# Patient Record
Sex: Male | Born: 1990 | Race: Black or African American | Hispanic: No | Marital: Single | Smoking: Current some day smoker
Health system: Southern US, Community
[De-identification: ages and names within clinical notes are randomized; demographics above are authoritative.]

---
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
## 2014-07-27 ENCOUNTER — Emergency Department (HOSPITAL_COMMUNITY)
Admission: EM | Admit: 2014-07-27 | Discharge: 2014-07-27 | Disposition: A | Discharge status: Home / Self Care | Payer: Medicaid Other | Attending: Emergency Medicine | Admitting: Emergency Medicine

## 2014-07-27 ENCOUNTER — Encounter (HOSPITAL_COMMUNITY): Payer: Self-pay | Admitting: *Deleted

## 2014-07-27 DIAGNOSIS — Z202 Contact with and (suspected) exposure to infections with a predominantly sexual mode of transmission: Secondary | ICD-10-CM | POA: Insufficient documentation

## 2014-07-27 DIAGNOSIS — Z720 Tobacco use: Secondary | ICD-10-CM | POA: Insufficient documentation

## 2014-07-27 MED ORDER — LIDOCAINE HCL 1 % IJ SOLN
INTRAMUSCULAR | Status: AC
  Filled 2014-07-27: qty 20

## 2014-07-27 MED ORDER — AZITHROMYCIN 250 MG PO TABS
1000.0000 mg | ORAL_TABLET | Freq: Once | ORAL | Status: AC
  Administered 2014-07-27: 1000 mg via ORAL
  Filled 2014-07-27: qty 4

## 2014-07-27 MED ORDER — CEFTRIAXONE SODIUM 250 MG IJ SOLR
250.0000 mg | Freq: Once | INTRAMUSCULAR | Status: AC
  Administered 2014-07-27: 250 mg via INTRAMUSCULAR
  Filled 2014-07-27: qty 250

## 2014-07-27 NOTE — ED Notes (Signed)
Pt reports exposure to STD.  Denies any sxs at this time

## 2014-07-27 NOTE — Discharge Instructions (Signed)

## 2014-07-27 NOTE — ED Provider Notes (Signed)
CSN: [id number]     Arrival date & time 07/27/14  1656 History  This chart was scribed for non-physician practitioner Langston MaskerKaren Sofia, PA-C, working with Arby BarretteMarcy Pfeiffer, MD, by Andrew Auaven Small, ED Scribe. This patient was seen in room WTR5/WTR5 and the patient's care was started at 5:28 PM.   Chief Complaint  Patient presents with  . Exposure to STD   The history is provided by the patient. No language interpreter was used.   William Werner is a 24 y.o. male who presents to the Emergency Department complaining of an exposure to STD. Pt reports the mother of his child is being treated for chlamydia. He denies symptoms including dysuria and discharge. Pt denies being treated for STDs in the past. Pt denies drug allergies.     History reviewed. No pertinent past medical history. History reviewed. No pertinent past surgical history. No family history on file. History  Substance Use Topics  . Smoking status: Current Every Day Smoker -- 1.00 packs/day    Types: Cigarettes  . Smokeless tobacco: Not on file  . Alcohol Use: Yes   Review of Systems  Genitourinary: Negative for dysuria and discharge.  All other systems reviewed and are negative.  Allergies  Review of patient's allergies indicates no known allergies.  Home Medications   Prior to Admission medications   Not on File  BP 131/70 mmHg  Pulse 73  Temp(Src) 98.2 F (36.8 C) (Oral)  Resp 16  SpO2 100% Physical Exam  Constitutional: He is oriented to person, place, and time. He appears well-developed and well-nourished. No distress.  HENT:  Head: Normocephalic and atraumatic.  Eyes: Conjunctivae and EOM are normal.  Neck: Neck supple.  Cardiovascular: Normal rate.   Pulmonary/Chest: Effort normal.  Genitourinary: Circumcised. No discharge found.  Musculoskeletal: Normal range of motion.  Neurological: He is alert and oriented to person, place, and time.  Skin: Skin is warm and dry.  Psychiatric: He has a normal mood and affect.  His behavior is normal.  Nursing note and vitals reviewed.  ED Course  Procedures  DIAGNOSTIC STUDIES: Oxygen Saturation is 100% on RA, normal by my interpretation.    COORDINATION OF CARE: 5:35 PM- Pt advised of plan for treatment and pt agrees.  Labs Review Labs Reviewed - No data to display  Imaging Review No results found.   EKG Interpretation None      MDM  gc and ct pending.     Final diagnoses:  Exposure to chlamydia    Rocephin  Zithromax Safe sex AVS   Lonia SkinnerLeslie K HarlanSofia, PA-C 07/27/14 1744  Arby BarretteMarcy Pfeiffer, MD 07/29/14 1451

## 2014-07-28 LAB — GC/CHLAMYDIA PROBE AMP (~~LOC~~) NOT AT ARMC
Chlamydia: POSITIVE — AB
Neisseria Gonorrhea: NEGATIVE

## 2014-07-30 ENCOUNTER — Telehealth (HOSPITAL_COMMUNITY): Payer: Self-pay

## 2014-07-30 NOTE — ED Notes (Signed)
Spoke with pt. Informed of labs. Positive for chlamydia. Treated per protocol. Advised to notify partner(s) for testing and treatment. Advised to abstain from sexual activity x 10 days.

## 2014-07-31 ENCOUNTER — Telehealth (HOSPITAL_BASED_OUTPATIENT_CLINIC_OR_DEPARTMENT_OTHER): Payer: Self-pay | Admitting: Emergency Medicine

## 2014-09-26 ENCOUNTER — Emergency Department (HOSPITAL_COMMUNITY)
Admission: EM | Admit: 2014-09-26 | Discharge: 2014-09-26 | Disposition: A | Discharge status: Home / Self Care | Payer: Medicaid Other | Attending: Emergency Medicine | Admitting: Emergency Medicine

## 2014-09-26 ENCOUNTER — Encounter (HOSPITAL_COMMUNITY): Payer: Self-pay | Admitting: Emergency Medicine

## 2014-09-26 DIAGNOSIS — Y998 Other external cause status: Secondary | ICD-10-CM | POA: Insufficient documentation

## 2014-09-26 DIAGNOSIS — Z720 Tobacco use: Secondary | ICD-10-CM | POA: Insufficient documentation

## 2014-09-26 DIAGNOSIS — Y9389 Activity, other specified: Secondary | ICD-10-CM | POA: Insufficient documentation

## 2014-09-26 DIAGNOSIS — L089 Local infection of the skin and subcutaneous tissue, unspecified: Secondary | ICD-10-CM | POA: Insufficient documentation

## 2014-09-26 DIAGNOSIS — Y288XXA Contact with other sharp object, undetermined intent, initial encounter: Secondary | ICD-10-CM | POA: Insufficient documentation

## 2014-09-26 DIAGNOSIS — Y929 Unspecified place or not applicable: Secondary | ICD-10-CM | POA: Insufficient documentation

## 2014-09-26 MED ORDER — IBUPROFEN 800 MG PO TABS: 800.0000 mg | ORAL_TABLET | Freq: Three times a day (TID) | ORAL | Status: DC

## 2014-09-26 MED ORDER — CEPHALEXIN 500 MG PO CAPS: 500.0000 mg | ORAL_CAPSULE | Freq: Four times a day (QID) | ORAL | Status: DC

## 2014-09-26 MED ORDER — CEPHALEXIN 500 MG PO CAPS
500.0000 mg | ORAL_CAPSULE | Freq: Once | ORAL | Status: AC
  Administered 2014-09-26: 500 mg via ORAL
  Filled 2014-09-26: qty 1

## 2014-09-26 NOTE — ED Provider Notes (Signed)
CSN: [id number]     Arrival date & time 09/26/14  2151 History  This chart was scribed for non-physician practitioner Elpidio Anis, PA-C working with Jerelyn Scott, MD by Murriel Hopper, ED Scribe. This patient was seen in room WTR5/WTR5 and the patient's care was started at 10:10 PM.    Chief Complaint  Patient presents with  . Finger Injury      Patient is a 24 y.o. male presenting with hand pain. The history is provided by the patient. No language interpreter was used.  Hand Pain This is a new problem. The current episode started more than 2 days ago. The problem occurs constantly. The problem has been gradually worsening. The symptoms are aggravated by bending. Nothing relieves the symptoms.     HPI Comments: William Werner is a 24 y.o. male who presents to the Emergency Department complaining of 10/10 constant, throbbing, aching, worsening distal right index finger pain with associated swelling and numbness that has been present for 5 days. 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 Pt states his friend gave him 2 antibiotics she had for a staph infection a few hours ago, but states that is the only thing he has tried for treatment of the pain.     No past medical history on file. No past surgical history on file. No family history on file. History  Substance Use Topics  . Smoking status: Current Every Day Smoker -- 1.00 packs/day    Types: Cigarettes  . Smokeless tobacco: Not on file  . Alcohol Use: Yes    Review of Systems  Constitutional: Negative for fever.  Musculoskeletal: Positive for joint swelling and arthralgias.      Allergies  Review of patient's allergies indicates no known allergies.  Home Medications   Prior to Admission medications   Not on File   There were no  vitals taken for this visit. Physical Exam  Constitutional: He is oriented to person, place, and time. He appears well-developed and well-nourished.  HENT:  Head: Normocephalic and atraumatic.  Cardiovascular: Normal rate.   Pulmonary/Chest: Effort normal.  Abdominal: He exhibits no distension.  Musculoskeletal:  Right index finger has a small (less than 0.5 cm) healed wound to distal palmar aspect. Pad of finger is tender, slightly swollen but soft without discoloration. Nail unremarkable, no paronychia.   Neurological: He is alert and oriented to person, place, and time.  Skin: Skin is warm and dry.  Psychiatric: He has a normal mood and affect.  Nursing note and vitals reviewed.   ED Course  Procedures (including critical care time)  DIAGNOSTIC STUDIES: Oxygen Saturation is 99% on room air, normal by my interpretation.    COORDINATION OF CARE: 10:16 PM Discussed treatment plan with pt at bedside and pt agreed to plan.   Labs Review Labs Reviewed - No data to display  Imaging Review No results found.   EKG Interpretation None      MDM   Final diagnoses:  None    1. Finger pain  Consider possibility of early felon. Will start antibiotics and encourage 2-day recheck to insure improvement.   I personally performed the services described in this documentation, which was scribed in my presence. The recorded information has been reviewed and is accurate.     Elpidio Anis,  PA-C 09/30/14 0001  Jerelyn Scott, MD 09/30/14 [phone]

## 2014-09-26 NOTE — Discharge Instructions (Signed)
Fingertip Infection °When an infection is around the nail, it is called a paronychia. When it appears over the tip of the finger, it is called a felon. These infections are due to minor injuries or cracks in the skin. If they are not treated properly, they can lead to bone infection and permanent damage to the fingernail. °Incision and drainage is necessary if a pus pocket (an abscess) has formed. Antibiotics and pain medicine may also be needed. Keep your hand elevated for the next 2-3 days to reduce swelling and pain. If a pack was placed in the abscess, it should be removed in 1-2 days by your caregiver. Soak the finger in warm water for 20 minutes 4 times daily to help promote drainage. °Keep the hands as dry as possible. Wear protective gloves with cotton liners. See your caregiver for follow-up care as recommended.  °HOME CARE INSTRUCTIONS  °· Keep wound clean, dry and dressed as suggested by your caregiver. °· Soak in warm salt water for fifteen minutes, four times per day for bacterial infections. °· Your caregiver will prescribe an antibiotic if a bacterial infection is suspected. Take antibiotics as directed and finish the prescription, even if the problem appears to be improving before the medicine is gone. °· Only take over-the-counter or prescription medicines for pain, discomfort, or fever as directed by your caregiver. °SEEK IMMEDIATE MEDICAL CARE IF: °· There is redness, swelling, or increasing pain in the wound. °· Pus or any other unusual drainage is coming from the wound. °· An unexplained oral temperature above 102° F (38.9° C) develops. °· You notice a foul smell coming from the wound or dressing. °MAKE SURE YOU:  °· Understand these instructions. °· Monitor your condition. °· Contact your caregiver if you are getting worse or not improving. °Document Released: 03/31/2004 Document Revised: 05/16/2011 Document Reviewed: 03/27/2008 °[employer]® Patient Information ©2015 [employer]. This  information is not intended to replace advice given to you by your health care provider. Make sure you discuss any questions you have with your health care provider. ° °

## 2014-09-26 NOTE — ED Notes (Signed)
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 Pt states now he has pain, redness and swelling to distal area of R index finger. Pt rates pain 10/10. Pt unsure of his last tetanus shot.

## 2015-07-09 ENCOUNTER — Encounter (HOSPITAL_COMMUNITY): Payer: Self-pay

## 2015-07-09 ENCOUNTER — Ambulatory Visit (INDEPENDENT_AMBULATORY_CARE_PROVIDER_SITE_OTHER): Payer: Self-pay

## 2015-07-09 ENCOUNTER — Ambulatory Visit (HOSPITAL_COMMUNITY)
Admission: EM | Admit: 2015-07-09 | Discharge: 2015-07-09 | Disposition: A | Discharge status: Home / Self Care | Payer: Self-pay | Attending: Family Medicine | Admitting: Family Medicine

## 2015-07-09 DIAGNOSIS — T148XXA Other injury of unspecified body region, initial encounter: Secondary | ICD-10-CM

## 2015-07-09 DIAGNOSIS — T148 Other injury of unspecified body region: Secondary | ICD-10-CM

## 2015-07-09 DIAGNOSIS — S81812A Laceration without foreign body, left lower leg, initial encounter: Secondary | ICD-10-CM

## 2015-07-09 DIAGNOSIS — Z23 Encounter for immunization: Secondary | ICD-10-CM

## 2015-07-09 MED ORDER — SULFAMETHOXAZOLE-TRIMETHOPRIM 800-160 MG PO TABS: 1.0000 | ORAL_TABLET | Freq: Two times a day (BID) | ORAL | Status: DC

## 2015-07-09 MED ORDER — HYDROCODONE-ACETAMINOPHEN 5-325 MG PO TABS: 2.0000 | ORAL_TABLET | ORAL | Status: DC | PRN

## 2015-07-09 MED ORDER — SULFAMETHOXAZOLE-TRIMETHOPRIM 800-160 MG PO TABS: 1.0000 | ORAL_TABLET | Freq: Two times a day (BID) | ORAL | Status: AC

## 2015-07-09 MED ORDER — TETANUS-DIPHTH-ACELL PERTUSSIS 5-2.5-18.5 LF-MCG/0.5 IM SUSP
INTRAMUSCULAR | Status: AC
  Filled 2015-07-09: qty 0.5

## 2015-07-09 MED ORDER — TETANUS-DIPHTH-ACELL PERTUSSIS 5-2.5-18.5 LF-MCG/0.5 IM SUSP
0.5000 mL | Freq: Once | INTRAMUSCULAR | Status: AC
  Administered 2015-07-09: 0.5 mL via INTRAMUSCULAR

## 2015-07-09 NOTE — ED Provider Notes (Signed)
CSN: [id number]     Arrival date & time 07/09/15  1731 History   First MD Initiated Contact with Patient 07/09/15 1824     Chief Complaint  Patient presents with  . Extremity Laceration   (Consider location/radiation/quality/duration/timing/severity/associated sxs/prior Treatment) Patient is a 25 y.o. male presenting with leg pain. The history is provided by the patient. No language interpreter was used.  Leg Pain Location:  Leg Time since incident:  1 day Injury: yes   Leg location:  L leg Pain details:    Quality:  Aching   Radiates to:  Does not radiate   Severity:  Moderate   Onset quality:  Gradual   Duration:  1 day   Timing:  Constant   Progression:  Worsening Chronicity:  New Foreign body present: paint. Tetanus status:  Out of date Relieved by:  Nothing Worsened by:  Nothing tried Ineffective treatments:  None tried Risk factors: no recent illness   Pt reports he was painting with a pressure sprayer and it cut his leg,  Pt reports paint shot in wound.    History reviewed. No pertinent past medical history. History reviewed. No pertinent past surgical history. No family history on file. Social History  Substance Use Topics  . Smoking status: Current Every Day Smoker -- 1.00 packs/day    Types: Cigarettes  . Smokeless tobacco: Never Used  . Alcohol Use: Yes     Comment: occasional     Review of Systems  All other systems reviewed and are negative.   Allergies  Review of patient's allergies indicates no known allergies.  Home Medications   Prior to Admission medications   Medication Sig Start Date End Date Taking? Authorizing Provider  cephALEXin (KEFLEX) 500 MG capsule Take 1 capsule (500 mg total) by mouth 4 (four) times daily. 09/26/14   Elpidio AnisShari Upstill, PA-C  ibuprofen (ADVIL,MOTRIN) 800 MG tablet Take 1 tablet (800 mg total) by mouth 3 (three) times daily. 09/26/14   Elpidio AnisShari Upstill, PA-C   Meds Ordered and Administered this Visit   Medications  Tdap  (BOOSTRIX) injection 0.5 mL (0.5 mLs Intramuscular Given 07/09/15 1842)    BP 126/78 mmHg  Pulse 60  Temp(Src) 98 F (36.7 C)  Resp 16  SpO2 100% No data found.   Physical Exam  Constitutional: He is oriented to person, place, and time. He appears well-developed and well-nourished.  HENT:  Head: Normocephalic.  Eyes: EOM are normal. Pupils are equal, round, and reactive to light.  Neck: Normal range of motion.  Cardiovascular: Normal rate.   Pulmonary/Chest: Effort normal.  Abdominal: Soft. He exhibits no distension.  Musculoskeletal: Normal range of motion.  Neurological: He is alert and oriented to person, place, and time.  Psychiatric: He has a normal mood and affect.  Nursing note and vitals reviewed.   ED Course  Procedures (including critical care time)  Labs Review Labs Reviewed - No data to display  Imaging Review Dg Femur Min 2 Views Left  07/09/2015  CLINICAL DATA:  High pressure paint injection injury to the left lower extremity EXAM: LEFT FEMUR 2 VIEWS COMPARISON:  None. FINDINGS: There is no evidence of fracture or other focal bone lesions. Soft tissues are unremarkable. IMPRESSION: Negative. Electronically Signed   By: Delbert PhenixJason A Poff M.D.   On: 07/09/2015 19:15     Visual Acuity Review  Right Eye Distance:   Left Eye Distance:   Bilateral Distance:    Right Eye Near:   Left Eye Near:  Bilateral Near:         MDM I discussed with Dr. Rayburn Ma.   He advised have pt follow up on Monday.  To ED if any signs of infection.   Bactrim, Hydrocodone for pain.    1. Laceration of skin of lower leg, left, initial encounter   2. Foreign body in skin        Elson Areas, PA-C 07/09/15 1939

## 2015-07-09 NOTE — ED Notes (Addendum)
Patient states his left leg was cut by a paint sprayer while at work today. Patient states some pain got into his wound. No acute distress

## 2015-07-09 NOTE — Discharge Instructions (Signed)

## 2017-08-16 ENCOUNTER — Other Ambulatory Visit: Payer: Self-pay

## 2017-08-16 ENCOUNTER — Encounter (HOSPITAL_COMMUNITY): Payer: Self-pay | Admitting: Emergency Medicine

## 2017-08-16 ENCOUNTER — Ambulatory Visit (HOSPITAL_COMMUNITY)
Admission: EM | Admit: 2017-08-16 | Discharge: 2017-08-16 | Disposition: A | Discharge status: Home / Self Care | Payer: Self-pay | Attending: Family Medicine | Admitting: Family Medicine

## 2017-08-16 DIAGNOSIS — W268XXA Contact with other sharp object(s), not elsewhere classified, initial encounter: Secondary | ICD-10-CM

## 2017-08-16 DIAGNOSIS — S91331A Puncture wound without foreign body, right foot, initial encounter: Secondary | ICD-10-CM

## 2017-08-16 MED ORDER — IBUPROFEN 800 MG PO TABS: 800.0000 mg | ORAL_TABLET | Freq: Three times a day (TID) | ORAL | 0 refills | Status: DC

## 2017-08-16 MED ORDER — CEPHALEXIN 500 MG PO CAPS: 500.0000 mg | ORAL_CAPSULE | Freq: Four times a day (QID) | ORAL | 0 refills | Status: AC

## 2017-08-16 NOTE — ED Triage Notes (Signed)
Pt punctured his right foot with two screws yesterday.  Unclear if it went through the flip flop first.  Pt had a tetanus injection 13 months ago.

## 2017-08-16 NOTE — Discharge Instructions (Signed)
Begin keflex- 4 times a day for 7 days  Use anti-inflammatories for pain/swelling. You may take up to 800 mg Ibuprofen every 8 hours with food. You may supplement Ibuprofen with Tylenol [phone] mg every 8 hours.   Ice  Elevate

## 2017-08-16 NOTE — ED Notes (Signed)
Ace wrap applied by Patterson HammersmithHallie Wieters, PA

## 2017-08-16 NOTE — ED Provider Notes (Signed)
[facility]    CSN: [id number] Arrival date & time: 08/16/17  1535     History   Chief Complaint Chief Complaint  Patient presents with  . Puncture Wound    HPI William Werner is a 27 y.o. male no significant past medical history presenting today for evaluation of a puncture wound.  Patient states that he accidentally stepped onto 2 screws yesterday as he was trying to get over a guardrail.  Notes that the screws were rusty.  States that they did not stay in his foot, did not require removing.  He believes that the entire screw was intact upon inspection after stepping.  Since he has had pain and redness around the 2 wounds on his right foot foot.  Denies numbness or tingling.  Is having pain with weightbearing due to swelling and pain, but is ambulating.  Has not taken anything for pain, has tried an antibiotic ointment around the puncture wounds.  Last tetanus shot was 13 months ago.  HPI  History reviewed. No pertinent past medical history.  Patient Active Problem List   Diagnosis Date Noted  . CONTACT DERMATITIS 06/23/2008  . PHARYNGITIS 12/01/2006    History reviewed. No pertinent surgical history.     Home Medications    Prior to Admission medications   Medication Sig Start Date End Date Taking? Authorizing Provider  cephALEXin (KEFLEX) 500 MG capsule Take 1 capsule (500 mg total) by mouth 4 (four) times daily for 7 days. 08/16/17 08/23/17  Kip Cropp C, PA-C  ibuprofen (ADVIL,MOTRIN) 800 MG tablet Take 1 tablet (800 mg total) by mouth 3 (three) times daily. 08/16/17   Gicela Schwarting, Junius Creamer, PA-C    Family History History reviewed. No pertinent family history.  Social History Social History   Tobacco Use  . Smoking status: Current Every Day Smoker    Packs/day: 1.00    Types: Cigarettes  . Smokeless tobacco: Never Used  Substance Use Topics  . Alcohol use: Yes    Comment: occasional   . Drug use: No     Allergies   Patient has no known  allergies.   Review of Systems Review of Systems  Constitutional: Negative for fatigue and fever.  Eyes: Negative for redness, itching and visual disturbance.  Respiratory: Negative for shortness of breath.   Cardiovascular: Negative for chest pain and leg swelling.  Gastrointestinal: Negative for nausea and vomiting.  Musculoskeletal: Positive for gait problem and myalgias. Negative for arthralgias.  Skin: Positive for color change and wound. Negative for rash.  Neurological: Negative for dizziness, syncope, weakness, light-headedness and headaches.     Physical Exam Triage Vital Signs ED Triage Vitals  Enc Vitals Group     BP 08/16/17 1547 134/75     Pulse Rate 08/16/17 1547 83     Resp --      Temp 08/16/17 1547 98.3 F (36.8 C)     Temp Source 08/16/17 1547 Oral     SpO2 08/16/17 1547 100 %     Weight --      Height --      Head Circumference --      Peak Flow --      Pain Score 08/16/17 1546 7     Pain Loc --      Pain Edu? --      Excl. in GC? --    No data found.  Updated Vital Signs BP 134/75 (BP Location: Left Arm)   Pulse 83   Temp  98.3 F (36.8 C) (Oral)   SpO2 100%   Visual Acuity Right Eye Distance:   Left Eye Distance:   Bilateral Distance:    Right Eye Near:   Left Eye Near:    Bilateral Near:     Physical Exam  Constitutional: He appears well-developed and well-nourished.  HENT:  Head: Normocephalic and atraumatic.  Eyes: Conjunctivae are normal.  Neck: Neck supple.  Cardiovascular: Normal rate and regular rhythm.  No murmur heard. Pulmonary/Chest: Effort normal and breath sounds normal. No respiratory distress.  Abdominal: Soft. There is no tenderness.  Musculoskeletal: He exhibits no edema.  Neurological: He is alert.  Skin: Skin is warm and dry.  1 puncture wound to lateral aspect of right foot as well as medial plantar surface closer towards heel, surrounding erythema and tenderness extending outward of puncture areas Dorsalis  pedis 2+, cap refill less than 2 seconds  Small superficial scrape to dorsal aspect of left foot  Psychiatric: He has a normal mood and affect.  Nursing note and vitals reviewed.    UC Treatments / Results  Labs (all labs ordered are listed, but only abnormal results are displayed) Labs Reviewed - No data to display  EKG None  Radiology No results found.  Procedures Procedures (including critical care time)  Medications Ordered in UC Medications - No data to display  Initial Impression / Assessment and Plan / UC Course  I have reviewed the triage vital signs and the nursing notes.  Pertinent labs & imaging results that were available during my care of the patient were reviewed by me and considered in my medical decision making (see chart for details).     Patient with puncture wound, tetanus updated.  We will go ahead and initiate on Keflex, advised to monitor for worsening redness, swelling and pain, drainage.  Tylenol and ibuprofen for pain and swelling.  Ice and elevation.  Did provide Ace wrap to help with swelling.Discussed strict return precautions. Patient verbalized understanding and is agreeable with plan.  Final Clinical Impressions(s) / UC Diagnoses   Final diagnoses:  Puncture wound of right foot, initial encounter     Discharge Instructions     Begin keflex- 4 times a day for 7 days  Use anti-inflammatories for pain/swelling. You may take up to 800 mg Ibuprofen every 8 hours with food. You may supplement Ibuprofen with Tylenol [phone] mg every 8 hours.   Ice  Elevate   ED Prescriptions    Medication Sig Dispense Auth. Provider   ibuprofen (ADVIL,MOTRIN) 800 MG tablet Take 1 tablet (800 mg total) by mouth 3 (three) times daily. 30 tablet Cashe Gatt C, PA-C   cephALEXin (KEFLEX) 500 MG capsule Take 1 capsule (500 mg total) by mouth 4 (four) times daily for 7 days. 28 capsule Kalei Meda C, PA-C     Controlled Substance Prescriptions [place]  Controlled Substance Registry consulted? Not Applicable   Lew DawesWieters, Brook Geraci C, [place] JerseyPA-C 08/16/17 1626

## 2018-06-03 ENCOUNTER — Encounter (HOSPITAL_COMMUNITY): Payer: Self-pay | Admitting: Emergency Medicine

## 2018-06-03 ENCOUNTER — Other Ambulatory Visit: Payer: Self-pay

## 2018-06-03 ENCOUNTER — Emergency Department (HOSPITAL_COMMUNITY): Payer: Self-pay

## 2018-06-03 ENCOUNTER — Emergency Department (HOSPITAL_COMMUNITY)
Admission: EM | Admit: 2018-06-03 | Discharge: 2018-06-03 | Disposition: A | Discharge status: Home / Self Care | Payer: Self-pay | Attending: Emergency Medicine | Admitting: Emergency Medicine

## 2018-06-03 DIAGNOSIS — S61412A Laceration without foreign body of left hand, initial encounter: Secondary | ICD-10-CM | POA: Insufficient documentation

## 2018-06-03 DIAGNOSIS — Y929 Unspecified place or not applicable: Secondary | ICD-10-CM | POA: Insufficient documentation

## 2018-06-03 DIAGNOSIS — Z79899 Other long term (current) drug therapy: Secondary | ICD-10-CM | POA: Insufficient documentation

## 2018-06-03 DIAGNOSIS — W25XXXA Contact with sharp glass, initial encounter: Secondary | ICD-10-CM | POA: Insufficient documentation

## 2018-06-03 DIAGNOSIS — Y999 Unspecified external cause status: Secondary | ICD-10-CM | POA: Insufficient documentation

## 2018-06-03 DIAGNOSIS — S61422A Laceration with foreign body of left hand, initial encounter: Secondary | ICD-10-CM

## 2018-06-03 DIAGNOSIS — Y939 Activity, unspecified: Secondary | ICD-10-CM | POA: Insufficient documentation

## 2018-06-03 DIAGNOSIS — F17210 Nicotine dependence, cigarettes, uncomplicated: Secondary | ICD-10-CM | POA: Insufficient documentation

## 2018-06-03 MED ORDER — STERILE WATER FOR INJECTION IJ SOLN
INTRAMUSCULAR | Status: AC
  Administered 2018-06-03: 2.5 mL
  Filled 2018-06-03: qty 10

## 2018-06-03 MED ORDER — LIDOCAINE-EPINEPHRINE (PF) 2 %-1:200000 IJ SOLN
20.0000 mL | Freq: Once | INTRAMUSCULAR | Status: AC
  Administered 2018-06-03: 20 mL

## 2018-06-03 MED ORDER — HYDROCODONE-ACETAMINOPHEN 5-325 MG PO TABS
1.0000 | ORAL_TABLET | Freq: Once | ORAL | Status: AC
  Administered 2018-06-03: 1 via ORAL
  Filled 2018-06-03: qty 1

## 2018-06-03 MED ORDER — AMOXICILLIN-POT CLAVULANATE 875-125 MG PO TABS: 1.0000 | ORAL_TABLET | Freq: Two times a day (BID) | ORAL | 0 refills | Status: DC

## 2018-06-03 MED ORDER — CEFAZOLIN SODIUM 1 G IJ SOLR
1.0000 g | Freq: Once | INTRAMUSCULAR | Status: AC
  Administered 2018-06-03: 1 g via INTRAMUSCULAR
  Filled 2018-06-03: qty 10

## 2018-06-03 NOTE — ED Provider Notes (Signed)
[facility] EMERGENCY DEPARTMENT Provider Note   CSN: [id number] Arrival date & time: 06/03/18  1907    History   Chief Complaint Chief Complaint  Patient presents with   Laceration    HPI William Werner is a 28 y.o. male presenting for evaluation of hand laceration.  Patient is a just prior to arrival he was jumping over a fence when he went to catch himself on the ground and he cut his left hand on glass.  He reports acute onset pain and bleeding.  He feels like there is still stuff in his cut.  He reports some numbness of his pinky and ring finger, but can feel sensation.  He reports increased pain with movement of his fingers, but is able to do so.  He denies injury elsewhere.  His tetanus is up-to-date.  Patient states he has no medical problems, takes medications daily.  He is not on blood thinners.   Pt is L hand dominant      HPI  History reviewed. No pertinent past medical history.  Patient Active Problem List   Diagnosis Date Noted   CONTACT DERMATITIS 06/23/2008   PHARYNGITIS 12/01/2006    History reviewed. No pertinent surgical history.      Home Medications    Prior to Admission medications   Medication Sig Start Date End Date Taking? Authorizing Provider  amoxicillin-clavulanate (AUGMENTIN) 875-125 MG tablet Take 1 tablet by mouth every 12 (twelve) hours. 06/03/18   Itzamar Traynor, PA-C  ibuprofen (ADVIL,MOTRIN) 800 MG tablet Take 1 tablet (800 mg total) by mouth 3 (three) times daily. 08/16/17   Wieters, Junius Creamer, PA-C    Family History History reviewed. No pertinent family history.  Social History Social History   Tobacco Use   Smoking status: Current Every Day Smoker    Packs/day: 1.00    Types: Cigarettes   Smokeless tobacco: Never Used  Substance Use Topics   Alcohol use: Yes    Comment: occasional    Drug use: No     Allergies   Patient has no known allergies.   Review of Systems Review of Systems    Skin: Positive for wound.  Hematological: Does not bruise/bleed easily.     Physical Exam Updated Vital Signs BP 115/68 (BP Location: Right Arm)    Pulse 88    Temp 98.5 F (36.9 C)    Resp 20    SpO2 98%   Physical Exam Vitals signs and nursing note reviewed.  Constitutional:      General: He is not in acute distress.    Appearance: He is well-developed.  HENT:     Head: Normocephalic and atraumatic.  Eyes:     Conjunctiva/sclera: Conjunctivae normal.     Pupils: Pupils are equal, round, and reactive to light.  Neck:     Musculoskeletal: Normal range of motion and neck supple.  Cardiovascular:     Rate and Rhythm: Normal rate and regular rhythm.  Pulmonary:     Effort: Pulmonary effort is normal. No respiratory distress.     Breath sounds: Normal breath sounds. No wheezing.  Abdominal:     General: Bowel sounds are normal. There is no distension.     Palpations: Abdomen is soft.     Tenderness: There is no abdominal tenderness.  Musculoskeletal: Normal range of motion.     Comments: Large and deep laceration of the ulnar aspect of the left palm with minimal bleeding.  Full active range of motion of  the pinky and middle finger.  Sensation intact.  Good cap refill. See pictures below  Skin:    General: Skin is warm and dry.     Capillary Refill: Capillary refill takes less than 2 seconds.  Neurological:     Mental Status: He is alert and oriented to person, place, and time.            ED Treatments / Results  Labs (all labs ordered are listed, but only abnormal results are displayed) Labs Reviewed - No data to display  EKG None  Radiology Dg Hand Complete Left  Result Date: 06/03/2018 CLINICAL DATA:  28 year old male jumped a fence and landed and a broken glass. EXAM: LEFT HAND - COMPLETE 3+ VIEW COMPARISON:  None. FINDINGS: There is no acute fracture or dislocation. The bones are well mineralized. No arthritic changes. Multiple radiopaque fragments noted  in the volar soft tissues of the hand over the head of the fifth metacarpal compromised of two larger fragments with the largest measuring approximately 7 x 4 mm and several smaller fragments. A 2 mm linear radiopaque fragment over the head of the third metacarpal is also noted, likely an additional glass fragment. There is laceration of the soft tissues over the fifth metacarpal. IMPRESSION: 1. No acute fracture or dislocation. 2. Multiple radiopaque foreign bodies as described. Electronically Signed   By: Elgie Collard M.D.   On: 06/03/2018 20:14    Procedures .Marland KitchenLaceration Repair Date/Time: 06/03/2018 9:19 PM Performed by: Alveria Apley, PA-C Authorized by: Alveria Apley, PA-C   Consent:    Consent obtained:  Verbal   Consent given by:  Patient   Risks discussed:  Infection, nerve damage, pain, poor cosmetic result, poor wound healing, need for additional repair, retained foreign body, tendon damage and vascular damage Anesthesia (see MAR for exact dosages):    Anesthesia method:  Local infiltration   Local anesthetic:  Lidocaine 2% WITH epi Laceration details:    Location:  Hand   Hand location:  L palm   Length (cm):  3   Depth (mm):  10 Repair type:    Repair type:  Intermediate Pre-procedure details:    Preparation:  Patient was prepped and draped in usual sterile fashion and imaging obtained to evaluate for foreign bodies Exploration:    Wound exploration: wound explored through full range of motion and entire depth of wound probed and visualized     Wound extent: foreign bodies/material (removed prior to suture) and muscle damage (visible muscle damage. strength intact)     Wound extent: no tendon damage noted and no underlying fracture noted     Foreign bodies/material:  Glass/debris Treatment:    Area cleansed with:  Saline   Amount of cleaning:  Extensive   Irrigation solution:  Sterile saline   Irrigation volume:  1L   Irrigation method:  Syringe   Visualized  foreign bodies/material removed: yes   Skin repair:    Repair method:  Sutures   Suture size:  3-0   Suture material:  Prolene   Suture technique:  Simple interrupted   Number of sutures:  6 Approximation:    Approximation:  Close Post-procedure details:    Dressing:  Bulky dressing   Patient tolerance of procedure:  Tolerated well, no immediate complications .Foreign Body Removal Date/Time: 06/03/2018 9:23 PM Performed by: Alveria Apley, PA-C Authorized by: Alveria Apley, PA-C  Consent: Verbal consent obtained. Risks and benefits: risks, benefits and alternatives were discussed Consent given by: patient Patient understanding: patient  states understanding of the procedure being performed Patient consent: the patient's understanding of the procedure matches consent given Procedure consent: procedure consent matches procedure scheduled Relevant documents: relevant documents present and verified Test results: test results available and properly labeled Imaging studies: imaging studies available Patient identity confirmed: verbally with patient Intake: hand. Anesthesia: local infiltration  Anesthesia: Local Anesthetic: lidocaine 2% with epinephrine Anesthetic total: 7 mL  Sedation: Patient sedated: no  Patient restrained: no Patient cooperative: yes Complexity: simple 2 objects recovered. Objects recovered: glass/debris Post-procedure assessment: foreign body removed Patient tolerance: Patient tolerated the procedure well with no immediate complications   (including critical care time)  Medications Ordered in ED Medications  ceFAZolin (ANCEF) injection 1 g (has no administration in time range)  sterile water (preservative free) injection (has no administration in time range)  HYDROcodone-acetaminophen (NORCO/VICODIN) 5-325 MG per tablet 1 tablet (1 tablet Oral Given 06/03/18 2048)  lidocaine-EPINEPHrine (XYLOCAINE W/EPI) 2 %-1:200000 (PF) injection 20 mL (20 mLs  Infiltration Given 06/03/18 2050)     Initial Impression / Assessment and Plan / ED Course  I have reviewed the triage vital signs and the nursing notes.  Pertinent labs & imaging results that were available during my care of the patient were reviewed by me and considered in my medical decision making (see chart for details).        Presenting for evaluation of left hand laceration.  Physical exam shows patient who is neurovascularly intact.  Considering location and history, will obtain x-ray for further evaluation.  X-ray shows multiple foreign bodies.  Wound numbed and irrigated.  Foreign bodies removed.  On further investigation, wound is extensive, approximately 1 cm deep.  Obvious muscle damage, but no decreased strength.  Case discussed with attending, Dr. Rush Landmark evaluated the patient.  Recommends consult with hand, but likely repair in the ED and follow-up in office.  Discussed with Dr. Aundria Rud from hand, who agrees with repair in the ED and follow-up in the office.  Recommends skin repair 3-0 Prolene sutures and a bulky soft dressing. Recommends dose of IV/IM in the ED and Augmentin for home.   Laceration repaired as described above.  Aftercare instructions given.  Encourage patient to follow-up with Dr. Aundria Rud.  At this time, patient appears safe for discharge.  Return precautions given.  Patient states he understands and agrees to plan.  Final Clinical Impressions(s) / ED Diagnoses   Final diagnoses:  Laceration of left hand with foreign body, initial encounter    ED Discharge Orders         Ordered    amoxicillin-clavulanate (AUGMENTIN) 875-125 MG tablet  Every 12 hours     06/03/18 2124           Alveria Apley, PA-C 06/03/18 2125    Tegeler, Canary Brim, MD 06/03/18 [id number]

## 2018-06-03 NOTE — ED Triage Notes (Signed)
Pt reports trying to jump a fence he tripped and caught himself with his hands but there was glass on the ground. Pt has laceration to inner palm of L hand, bleeding controlled. Pt unsure last tetanus shot.

## 2018-06-03 NOTE — Discharge Instructions (Addendum)
Take antibiotics as described.  Take the entire course. Use Tylenol and ibuprofen as needed for pain.  Use ice, 20 minutes at a time, to help with pain and swelling.  Keep hand elevated when able.  Keep the bulky dressing dry and on your hand until you follow-up with Dr. Aundria Rud. Call Dr. Aundria Rud office tomorrow to set up follow-up appointment for Friday. Return to the emergency room if you develop high fevers, severe worsening pain, bleeding persisting after 20 minutes of direct pressure, or any with new, worsening, or concerning symptoms.

## 2018-06-22 ENCOUNTER — Other Ambulatory Visit: Payer: Self-pay

## 2018-06-22 ENCOUNTER — Emergency Department (HOSPITAL_COMMUNITY)
Admission: EM | Admit: 2018-06-22 | Discharge: 2018-06-22 | Disposition: A | Discharge status: Home / Self Care | Payer: Medicaid Other | Attending: Emergency Medicine | Admitting: Emergency Medicine

## 2018-06-22 ENCOUNTER — Encounter (HOSPITAL_COMMUNITY): Payer: Self-pay | Admitting: Pharmacy Technician

## 2018-06-22 DIAGNOSIS — F17210 Nicotine dependence, cigarettes, uncomplicated: Secondary | ICD-10-CM | POA: Insufficient documentation

## 2018-06-22 DIAGNOSIS — Z4802 Encounter for removal of sutures: Secondary | ICD-10-CM | POA: Insufficient documentation

## 2018-06-22 DIAGNOSIS — Z5189 Encounter for other specified aftercare: Secondary | ICD-10-CM

## 2018-06-22 DIAGNOSIS — Z79899 Other long term (current) drug therapy: Secondary | ICD-10-CM | POA: Insufficient documentation

## 2018-06-22 NOTE — ED Triage Notes (Signed)
Pt arrives via pov with stitches in L hand. Sutures appear intact. Pt was supposed to follow up with hand specialist and due to cost was unable to do so.

## 2018-06-22 NOTE — ED Provider Notes (Signed)
[facility] EMERGENCY DEPARTMENT Provider Note   CSN: [id number] Arrival date & time: 06/22/18  1727    History   Chief Complaint Chief Complaint  Patient presents with  . Wound Check    HPI William Werner is a 28 y.o. male with a PMH of contact dermatitis and pharyngitis presenting for a wound check and suture removal. Patient reports he had a laceration on his left hand after jumping a fence and cutting his hand on a glass. Patient reports he had his laceration repaired on 03/29 and was supposed to follow up with hand surgery, however, he could not afford this and did not follow up. Patient denies any discharge, pain, or erythema on wound. Patient denies fever, chills, nausea, vomiting, or abdominal pain. Patient states he completed his antibiotics and his tetanus shot was updated last visit. Patient states he is able to move his hand without difficulty. Patient denies any acute complaints. Patient states he is left handed dominant.     HPI  History reviewed. No pertinent past medical history.  Patient Active Problem List   Diagnosis Date Noted  . CONTACT DERMATITIS 06/23/2008  . PHARYNGITIS 12/01/2006    History reviewed. No pertinent surgical history.      Home Medications    Prior to Admission medications   Medication Sig Start Date End Date Taking? Authorizing Provider  amoxicillin-clavulanate (AUGMENTIN) 875-125 MG tablet Take 1 tablet by mouth every 12 (twelve) hours. 06/03/18   Caccavale, Sophia, PA-C  ibuprofen (ADVIL,MOTRIN) 800 MG tablet Take 1 tablet (800 mg total) by mouth 3 (three) times daily. 08/16/17   Wieters, Junius Creamer, PA-C    Family History No family history on file.  Social History Social History   Tobacco Use  . Smoking status: Current Every Day Smoker    Packs/day: 1.00    Types: Cigarettes  . Smokeless tobacco: Never Used  Substance Use Topics  . Alcohol use: Yes    Comment: occasional   . Drug use: No      Allergies   Patient has no known allergies.   Review of Systems Review of Systems  Constitutional: Negative for chills, diaphoresis and fever.  Respiratory: Negative for cough and shortness of breath.   Cardiovascular: Negative for chest pain.  Gastrointestinal: Negative for abdominal pain, nausea and vomiting.  Musculoskeletal: Negative for back pain.  Skin: Positive for wound. Negative for color change and rash.  Allergic/Immunologic: Negative for immunocompromised state.  Neurological: Negative for weakness and numbness.  Hematological: Does not bruise/bleed easily.    Physical Exam Updated Vital Signs BP 118/73 (BP Location: Right Arm)   Pulse 70   Temp 98.2 F (36.8 C) (Oral)   Resp 14   SpO2 98%   Physical Exam Vitals signs and nursing note reviewed.  Constitutional:      General: He is not in acute distress.    Appearance: He is well-developed. He is not diaphoretic.  HENT:     Head: Normocephalic and atraumatic.  Neck:     Musculoskeletal: Normal range of motion.  Cardiovascular:     Rate and Rhythm: Normal rate and regular rhythm.     Heart sounds: Normal heart sounds. No murmur. No friction rub. No gallop.   Pulmonary:     Effort: Pulmonary effort is normal. No respiratory distress.     Breath sounds: Normal breath sounds. No wheezing or rales.  Abdominal:     Palpations: Abdomen is soft.     Tenderness: There is  no abdominal tenderness.  Musculoskeletal: Normal range of motion.     Left wrist: Normal. He exhibits normal range of motion, no tenderness and no bony tenderness.     Right hand: He exhibits normal range of motion, no tenderness, no bony tenderness, normal capillary refill, no deformity, no laceration (.) and no swelling. Normal sensation noted. Normal strength noted.     Left hand: He exhibits laceration (Laceration repair is clean, dry, and intact. It appears to be healing appropriately. No signs of infection at this time). He exhibits normal  range of motion, no tenderness, no bony tenderness, normal capillary refill, no deformity and no swelling. Decreased sensation (Mild decreased sensation over wound. ) noted. Normal strength noted.  Skin:    General: Skin is warm.     Findings: No erythema or rash.  Neurological:     Mental Status: He is alert.     ED Treatments / Results  Labs (all labs ordered are listed, but only abnormal results are displayed) Labs Reviewed - No data to display  EKG None  Radiology No results found.  Procedures Procedures (including critical care time)  Medications Ordered in ED Medications - No data to display   Initial Impression / Assessment and Plan / ED Course  I have reviewed the triage vital signs and the nursing notes.  Pertinent labs & imaging results that were available during my care of the patient were reviewed by me and considered in my medical decision making (see chart for details).       Pt to ER for suture removal and wound check as above. Procedure tolerated well. Vitals normal, no signs of infection. Scar minimization & return precautions given at dc.    Final Clinical Impressions(s) / ED Diagnoses   Final diagnoses:  Visit for wound check  Visit for suture removal    ED Discharge Orders    None       Leretha DykesHernandez, Lucette Kratz P, PA-C 06/22/18 1840    Melene PlanFloyd, Dan, DO 06/22/18 1850

## 2018-06-22 NOTE — Discharge Instructions (Addendum)
You have been seen today for wound check and suture removal. Please read and follow all provided instructions.   1. Medications: usual home medications 2. Treatment: rest, drink plenty of fluids 3. Follow Up: Please follow up with your primary doctor in 7 days for discussion of your diagnoses and further evaluation after today's visit; if you do not have a primary care doctor use the resource guide provided to find one; Please return to the ER for any new or worsening symptoms. Please obtain all of your results from medical records or have your doctors office obtain the results - share them with your doctor - you should be seen at your doctors office. Call today to arrange your follow up.   Take medications as prescribed. Please review all of the medicines and only take them if you do not have an allergy to them. Return to the emergency room for worsening condition or new concerning symptoms. Follow up with your regular doctor. If you don't have a regular doctor use one of the numbers below to establish a primary care doctor.  Please be aware that if you are taking birth control pills, taking other prescriptions, ESPECIALLY ANTIBIOTICS may make the birth control ineffective - if this is the case, either do not engage in sexual activity or use alternative methods of birth control such as condoms until you have finished the medicine and your family doctor says it is OK to restart them. If you are on a blood thinner such as COUMADIN, be aware that any other medicine that you take may cause the coumadin to either work too much, or not enough - you should have your coumadin level rechecked in next 7 days if this is the case.  ?  It is also a possibility that you have an allergic reaction to any of the medicines that you have been prescribed - Everybody reacts differently to medications and while MOST people have no trouble with most medicines, you may have a reaction such as nausea, vomiting, rash, swelling,  shortness of breath. If this is the case, please stop taking the medicine immediately and contact your physician.  ?  You should return to the ER if you develop severe or worsening symptoms.   Emergency Department Resource Guide 1) Find a Doctor and Pay Out of Pocket Although you won't have to find out who is covered by your insurance plan, it is a good idea to ask around and get recommendations. You will then need to call the office and see if the doctor you have chosen will accept you as a new patient and what types of options they offer for patients who are self-pay. Some doctors offer discounts or will set up payment plans for their patients who do not have insurance, but you will need to ask so you aren't surprised when you get to your appointment.  2) Contact Your Local Health Department Not all health departments have doctors that can see patients for sick visits, but many do, so it is worth a call to see if yours does. If you don't know where your local health department is, you can check in your phone book. The CDC also has a tool to help you locate your state's health department, and many state websites also have listings of all of their local health departments.  3) Find a Walk-in Clinic If your illness is not likely to be very severe or complicated, you may want to try a walk in clinic. These are popping  up all over the country in pharmacies, drugstores, and shopping centers. They're usually staffed by nurse practitioners or physician assistants that have been trained to treat common illnesses and complaints. They're usually fairly quick and inexpensive. However, if you have serious medical issues or chronic medical problems, these are probably not your best option.  No Primary Care Doctor: Call Health Connect at  [phone] - they can help you locate a primary care doctor that  accepts your insurance, provides certain services, etc. Physician Referral Service- [phone]  Chronic  Pain Problems: Organization         Address  Phone   Notes  [facility]  [phone] Patients need to be referred by their primary care doctor.   Medication Assistance: Organization         Address  Phone   Notes  [facility] Medication [facility] [address] [place], [place] [address] [phone] --Must be a resident of [facility] -- Must have NO insurance coverage whatsoever (no Medicaid/ Medicare, etc.) -- The pt. MUST have a primary care doctor that directs their care regularly and follows them in the community   [facility]  [phone]   [employer]  [phone]    Agencies that provide inexpensive medical care: Organization         Address  Phone   Notes  [place]  [phone]   [facility]    [phone]   [facility] [place], [place] [address] [phone]   [place] [address] [place] [phone]   [facility]    [phone]   [facility]    [phone]   [place] and [place] [address], [place] Phone:  [phone], Fax:  [phone] Hours of Operation:  9 am - 6 pm, M-F.  Also accepts Medicaid/Medicare and self-pay.  [facility] for [address], [address], [place] Phone: [phone], Fax: [phone]. Hours of Operation:  8:30 am - 5:30 pm, M-F.  Also accepts Medicaid and self-pay.  [facility], [facility], [facility] [address] [place] Phone: [phone]   [place], [place], [place] [phone], Ext. [phone] Mondays & Thursdays: 7-9 AM.  First 15 patients are seen on a first come, first serve basis.    [place] Providers:  Organization         Address  Phone   Notes  [facility] [address], [place] [phone] Also  accepts self-pay patients.  [facility] [address], [place]  [phone]   [place], [address], [place] [phone]   [facility] [address], [place] [phone]   Lucianne Lei [address], [place]   [phone] Only accepts [place] patients after they have their name applied to their card.   Self-Pay (no insurance) in [facility]:  Organization         Address  Phone   Notes  Sickle Cell Patients, [facility] [place] [phone]   [facility] [place] [phone]   [facility] [place]  [address], [address], [place] [phone]   [facility]/Dr. Osei-Bonsu  [address] [place] or [address], [place] [phone] Phone number for both [place] and [place] locations is the same.  [facility] and [facility] [address], [place] [phone]   [facility] [address] [place] or [address] [phone]   [facility] [address], [place] [phone], phone; [phone], fax Sees patients 1st and 3rd Saturday of every month.  Must not qualify for public or private insurance (i.e. Medicaid, Medicare, [address] Health Choice, Veterans' Benefits)  Household income should be no more than 200% of the poverty level The clinic cannot treat you if you are pregnant or think you are pregnant  Sexually transmitted diseases are not treated at the clinic.

## 2018-11-20 ENCOUNTER — Ambulatory Visit: Payer: Self-pay

## 2020-03-12 ENCOUNTER — Other Ambulatory Visit: Payer: Self-pay

## 2020-03-12 ENCOUNTER — Emergency Department (HOSPITAL_COMMUNITY)
Admission: EM | Admit: 2020-03-12 | Discharge: 2020-03-12 | Disposition: A | Discharge status: Home / Self Care | Payer: 59 | Attending: Emergency Medicine | Admitting: Emergency Medicine

## 2020-03-12 ENCOUNTER — Encounter (HOSPITAL_COMMUNITY): Payer: Self-pay

## 2020-03-12 DIAGNOSIS — R509 Fever, unspecified: Secondary | ICD-10-CM | POA: Insufficient documentation

## 2020-03-12 DIAGNOSIS — R519 Headache, unspecified: Secondary | ICD-10-CM | POA: Diagnosis present

## 2020-03-12 DIAGNOSIS — Z5321 Procedure and treatment not carried out due to patient leaving prior to being seen by health care provider: Secondary | ICD-10-CM | POA: Diagnosis not present

## 2020-03-12 DIAGNOSIS — M791 Myalgia, unspecified site: Secondary | ICD-10-CM | POA: Diagnosis not present

## 2020-03-12 NOTE — ED Triage Notes (Signed)
Pt arrives POV for eval of covid sx. Reports HA, bodyaches, fever x 1 day. Unvaccinated

## 2020-03-13 ENCOUNTER — Other Ambulatory Visit: Payer: Self-pay

## 2020-03-13 ENCOUNTER — Ambulatory Visit (HOSPITAL_COMMUNITY)
Admission: EM | Admit: 2020-03-13 | Discharge: 2020-03-13 | Disposition: A | Discharge status: Home / Self Care | Payer: 59

## 2020-07-13 ENCOUNTER — Other Ambulatory Visit: Payer: Self-pay

## 2020-07-13 ENCOUNTER — Encounter (HOSPITAL_COMMUNITY): Payer: Self-pay

## 2020-07-13 ENCOUNTER — Ambulatory Visit (HOSPITAL_COMMUNITY)
Admission: EM | Admit: 2020-07-13 | Discharge: 2020-07-13 | Disposition: A | Discharge status: Home / Self Care | Payer: 59 | Attending: Emergency Medicine | Admitting: Emergency Medicine

## 2020-07-13 ENCOUNTER — Ambulatory Visit (INDEPENDENT_AMBULATORY_CARE_PROVIDER_SITE_OTHER): Payer: 59

## 2020-07-13 DIAGNOSIS — S90212A Contusion of left great toe with damage to nail, initial encounter: Secondary | ICD-10-CM

## 2020-07-13 DIAGNOSIS — L03032 Cellulitis of left toe: Secondary | ICD-10-CM

## 2020-07-13 DIAGNOSIS — R2242 Localized swelling, mass and lump, left lower limb: Secondary | ICD-10-CM

## 2020-07-13 DIAGNOSIS — L723 Sebaceous cyst: Secondary | ICD-10-CM | POA: Diagnosis not present

## 2020-07-13 MED ORDER — DOXYCYCLINE HYCLATE 100 MG PO CAPS: 100.0000 mg | ORAL_CAPSULE | Freq: Two times a day (BID) | ORAL | 0 refills | Status: AC

## 2020-07-13 NOTE — Discharge Instructions (Addendum)
For your thigh:  Apply a warm compress as needed.    I would recommend follow up with general surgery or dermatology for removal.    For your toe:  Take the doxycycline twice a day for the next 10 days.  Soak your toe in an epsom salt bath several times a day (3-4).    If you notice any drainage, you can come back for [place] to drain it.    If you notice any worsening redness, swelling, red streaks, or fevers, go to the Emergency Department for further evaluation.

## 2020-07-13 NOTE — ED Provider Notes (Signed)
[facility]    CSN: [id number] Arrival date & time: 07/13/20  1156      History   Chief Complaint Chief Complaint  Patient presents with  . toe swelling  . knot on thigh    HPI William Werner is a 30 y.o. male.   Patient here for evaluation of left great toe pain and swelling for the past 2 days.  Denies any known injury or trauma.  Reports pain when putting weight on toe or foot.  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  Reports taking a pain pill he received from a friend with minimal relief.   Also reports a "knot or bump" on left outer thigh.  Reports having some pain when putting pressure or laying on left side.   Reports cutting leg several years ago and this "knot" developed after that.  Denies any discharge or drainage from site.  Denies any fevers, chest pain, shortness of breath, N/V/D, numbness, tingling, weakness, abdominal pain, or headaches.     The history is provided by the patient.    History reviewed. No pertinent past medical history.  Patient Active Problem List   Diagnosis Date Noted  . CONTACT DERMATITIS 06/23/2008  . PHARYNGITIS 12/01/2006    History reviewed. No pertinent surgical history.     Home Medications    Prior to Admission medications   Medication Sig Start Date End Date Taking? Authorizing Provider  doxycycline (VIBRAMYCIN) 100 MG capsule Take 1 capsule (100 mg total) by mouth 2 (two) times daily for 10 days. 07/13/20 07/23/20 Yes Ivette Loyal, NP  amoxicillin-clavulanate (AUGMENTIN) 875-125 MG tablet Take 1 tablet by mouth every 12 (twelve) hours. 06/03/18   Caccavale, Sophia, PA-C  ibuprofen (ADVIL,MOTRIN) 800 MG tablet Take 1 tablet (800 mg total) by mouth 3 (three) times daily. 08/16/17   Wieters, Junius Creamer, PA-C    Family History History reviewed. No pertinent family history.  Social History Social History   Tobacco Use  . Smoking status: Current Some Day Smoker    Packs/day: 0.00    Types: Cigarettes   . Smokeless tobacco: Never Used  Substance Use Topics  . Alcohol use: Yes    Comment: occasional   . Drug use: No     Allergies   Patient has no known allergies.   Review of Systems Review of Systems  Musculoskeletal: Positive for arthralgias and joint swelling.  Skin: Positive for wound.  All other systems reviewed and are negative.    Physical Exam Triage Vital Signs ED Triage Vitals  Enc Vitals Group     BP 07/13/20 1321 135/86     Pulse Rate 07/13/20 1321 76     Resp 07/13/20 1321 18     Temp 07/13/20 1321 98.5 F (36.9 C)     Temp src --      SpO2 07/13/20 1321 98 %     Weight --      Height --      Head Circumference --      Peak Flow --      Pain Score 07/13/20 1319 10     Pain Loc --      Pain Edu? --      Excl. in GC? --    No data found.  Updated Vital Signs BP 135/86   Pulse 76   Temp 98.5 F (36.9 C)   Resp 18   SpO2 98%   Visual Acuity Right Eye Distance:   Left  Eye Distance:   Bilateral Distance:    Right Eye Near:   Left Eye Near:    Bilateral Near:     Physical Exam Vitals and nursing note reviewed.  Constitutional:      General: He is not in acute distress.    Appearance: Normal appearance. He is not ill-appearing, toxic-appearing or diaphoretic.  HENT:     Head: Normocephalic and atraumatic.  Eyes:     Conjunctiva/sclera: Conjunctivae normal.  Cardiovascular:     Rate and Rhythm: Normal rate.     Pulses: Normal pulses.  Pulmonary:     Effort: Pulmonary effort is normal.  Abdominal:     General: Abdomen is flat.  Musculoskeletal:     Cervical back: Normal range of motion.     Left foot: Decreased range of motion. Normal capillary refill. Swelling, tenderness and bony tenderness present. Normal pulse.     Comments: Left great toe with erythema, edema- see photo below  Feet:     Right foot:     Skin integrity: Skin integrity normal.     Left foot:     Skin integrity: Erythema and warmth present.  Skin:    General:  Skin is warm and dry.     Findings: Lesion (left outer thigh, reports injury mutiple years ago, see photo below) present.  Neurological:     General: No focal deficit present.     Mental Status: He is alert and oriented to person, place, and time.  Psychiatric:        Mood and Affect: Mood normal.    Left great toe      UC Treatments / Results  Labs (all labs ordered are listed, but only abnormal results are displayed) Labs Reviewed - No data to display  EKG   Radiology DG Toe Great Left  Result Date: 07/13/2020 CLINICAL DATA:  Redness and swelling of the left great toe. EXAM: LEFT GREAT TOE COMPARISON:  None. FINDINGS: No fracture or bone lesion. Joints are normally spaced and aligned. No degenerative or arthropathic changes. No erosions. Soft tissues are unremarkable. IMPRESSION: Negative. Electronically Signed   By: Amie Portland M.D.   On: 07/13/2020 14:10    Procedures Procedures (including critical care time)  Medications Ordered in UC Medications - No data to display  Initial Impression / Assessment and Plan / UC Course  I have reviewed the triage vital signs and the nursing notes.  Pertinent labs & imaging results that were available during my care of the patient were reviewed by me and considered in my medical decision making (see chart for details).     Sebaceous Cyst Follow up with general surgery or dermatology for removal.   You can take ibuprofen and/or Tylenol as needed for pain and fever.   Cellulitis Assessment negative for red flags or concerns.  Xray negative for acute bony abnormalities.  Doxycycline twice daily for the next 10 days.  Apply warm Compress or soak your toe in Epsom salt bath 3-4 times a day.   May take ibuprofen and/or Tylenol as needed for pain. May return for drainage if it comes to ahead otherwise continue to soak.  Strict ED follow-up for worsening redness, swelling, red streaks, or fevers.  Follow-up as needed Final Clinical  Impressions(s) / UC Diagnoses   Final diagnoses:  Sebaceous cyst  Cellulitis of toe of left foot     Discharge Instructions     For your thigh:  Apply a warm compress as needed.  I would recommend follow up with general surgery or dermatology for removal.    For your toe:  Take the doxycycline twice a day for the next 10 days.  Soak your toe in an epsom salt bath several times a day (3-4).    If you notice any drainage, you can come back for [place] to drain it.    If you notice any worsening redness, swelling, red streaks, or fevers, go to the Emergency Department for further evaluation.      ED Prescriptions    Medication Sig Dispense Auth. Provider   doxycycline (VIBRAMYCIN) 100 MG capsule Take 1 capsule (100 mg total) by mouth 2 (two) times daily for 10 days. 20 capsule Ivette Loyal, NP     PDMP not reviewed this encounter.   Ivette Loyal, NP 07/13/20 [phone]

## 2020-07-13 NOTE — ED Triage Notes (Addendum)
Pt with redness and swelling to left great toe x 1-2 days. States underside of foot and toe hurt a lot with any pressure that is placed on it. Denies numbness to toe but has limited movement to toe. States took half a percocet from a friend last night for the pain.  Of note pt reports stepping on a nail about 3 years ago toward front of foot and states has intermittent pain in foot from this injury for years especially when placing pressure on it. Pt also reports having a "knot"/bump on outside of left thigh for years which hurts when laying on it.

## 2022-01-31 ENCOUNTER — Encounter (HOSPITAL_COMMUNITY): Payer: Self-pay

## 2022-01-31 ENCOUNTER — Ambulatory Visit (HOSPITAL_COMMUNITY)
Admission: EM | Admit: 2022-01-31 | Discharge: 2022-01-31 | Disposition: A | Discharge status: Home / Self Care | Payer: PRIVATE HEALTH INSURANCE | Attending: Physician Assistant | Admitting: Physician Assistant

## 2022-01-31 DIAGNOSIS — R051 Acute cough: Secondary | ICD-10-CM | POA: Diagnosis present

## 2022-01-31 DIAGNOSIS — J069 Acute upper respiratory infection, unspecified: Secondary | ICD-10-CM | POA: Diagnosis not present

## 2022-01-31 DIAGNOSIS — Z1152 Encounter for screening for COVID-19: Secondary | ICD-10-CM | POA: Diagnosis not present

## 2022-01-31 LAB — RESP PANEL BY RT-PCR (FLU A&B, COVID) ARPGX2
Influenza A by PCR: NEGATIVE
Influenza B by PCR: NEGATIVE
SARS Coronavirus 2 by RT PCR: NEGATIVE

## 2022-01-31 MED ORDER — FLUTICASONE PROPIONATE 50 MCG/ACT NA SUSP: 1.0000 | Freq: Every day | NASAL | 0 refills | Status: DC

## 2022-01-31 MED ORDER — PROMETHAZINE-DM 6.25-15 MG/5ML PO SYRP: 5.0000 mL | ORAL_SOLUTION | Freq: Four times a day (QID) | ORAL | 0 refills | Status: DC | PRN

## 2022-01-31 NOTE — ED Triage Notes (Signed)
TC to Pt on Mobile no answer .

## 2022-01-31 NOTE — Discharge Instructions (Signed)
I believe that you have a virus.  We tested you for flu and COVID we will contact you in the next several days if you are positive.  Use Promethazine DM for cough.  This will make you sleepy do not drive or drink alcohol taking it.  Use Flonase, Mucinex, Tylenol for additional symptom relief.  Make sure you rest and drink plenty of fluid.  If you have any worsening symptoms including high fever not responding to medication, chest pain, shortness of breath, nausea/vomiting interfere with oral intake, weakness you need to go to the emergency room immediately.

## 2022-01-31 NOTE — ED Notes (Signed)
Called pt but, no answer  

## 2022-01-31 NOTE — ED Provider Notes (Signed)
[facility]    CSN: [id number] Arrival date & time: 01/31/22  1112      History   Chief Complaint No chief complaint on file.   HPI William Werner is a 31 y.o. male.   Patient presents today with a 3 to 4-day history of URI symptoms including cough, congestion, fever, shortness of breath, body aches, chills, sore throat.  Denies chest pain, abdominal pain, diarrhea.  Denies any known sick contacts.  He has not had COVID in the past.  He has not had COVID or flu vaccinations.  He has tried Robitussin without improvement of symptoms.  Denies any recent antibiotics or steroids.  He denies any significant past medical history including allergies, asthma, COPD, smoking.  He is having difficulty with daily activities as result of symptoms.    History reviewed. No pertinent past medical history.  Patient Active Problem List   Diagnosis Date Noted   CONTACT DERMATITIS 06/23/2008   PHARYNGITIS 12/01/2006    History reviewed. No pertinent surgical history.     Home Medications    Prior to Admission medications   Medication Sig Start Date End Date Taking? Authorizing Provider  fluticasone (FLONASE) 50 MCG/ACT nasal spray Place 1 spray into both nostrils daily. 01/31/22  Yes Samia Kukla K, PA-C  promethazine-dextromethorphan (PROMETHAZINE-DM) 6.25-15 MG/5ML syrup Take 5 mLs by mouth 4 (four) times daily as needed for cough. 01/31/22  Yes Dionisia Pacholski, Noberto Retort, PA-C    Family History History reviewed. No pertinent family history.  Social History Social History   Tobacco Use   Smoking status: Some Days    Packs/day: 0.00    Types: Cigarettes   Smokeless tobacco: Never  Substance Use Topics   Alcohol use: Yes    Comment: occasional    Drug use: No     Allergies   Patient has no known allergies.   Review of Systems Review of Systems  Constitutional:  Positive for activity change, fatigue and fever. Negative for appetite change.  HENT:  Positive for  congestion, postnasal drip, sinus pressure and sore throat. Negative for sneezing.   Respiratory:  Positive for cough and shortness of breath.   Cardiovascular:  Negative for chest pain.  Gastrointestinal:  Positive for nausea and vomiting. Negative for abdominal pain and diarrhea.  Musculoskeletal:  Positive for arthralgias and myalgias.  Neurological:  Positive for headaches. Negative for dizziness and light-headedness.     Physical Exam Triage Vital Signs ED Triage Vitals  Enc Vitals Group     BP 01/31/22 1333 126/81     Pulse Rate 01/31/22 1333 91     Resp 01/31/22 1333 16     Temp 01/31/22 1333 98.1 F (36.7 C)     Temp Source 01/31/22 1333 Oral     SpO2 01/31/22 1333 96 %     Weight --      Height --      Head Circumference --      Peak Flow --      Pain Score 01/31/22 1332 0     Pain Loc --      Pain Edu? --      Excl. in GC? --    No data found.  Updated Vital Signs BP 126/81 (BP Location: Right Arm)   Pulse 91   Temp 98.1 F (36.7 C) (Oral)   Resp 16   SpO2 96%   Visual Acuity Right Eye Distance:   Left Eye Distance:   Bilateral Distance:  Right Eye Near:   Left Eye Near:    Bilateral Near:     Physical Exam Vitals reviewed.  Constitutional:      General: He is awake.     Appearance: Normal appearance. He is well-developed. He is not ill-appearing.     Comments: Very pleasant male appears stated age in no acute distress sitting comfortably in exam room  HENT:     Head: Normocephalic and atraumatic.     Right Ear: Tympanic membrane, ear canal and external ear normal. Tympanic membrane is not erythematous or bulging.     Left Ear: Tympanic membrane, ear canal and external ear normal. Tympanic membrane is not erythematous or bulging.     Nose:     Right Sinus: Maxillary sinus tenderness and frontal sinus tenderness present.     Left Sinus: Maxillary sinus tenderness and frontal sinus tenderness present.     Mouth/Throat:     Pharynx: Uvula  midline. Posterior oropharyngeal erythema present. No oropharyngeal exudate.  Cardiovascular:     Rate and Rhythm: Normal rate and regular rhythm.     Heart sounds: Normal heart sounds, S1 normal and S2 normal. No murmur heard. Pulmonary:     Effort: Pulmonary effort is normal. No accessory muscle usage or respiratory distress.     Breath sounds: Normal breath sounds. No stridor. No wheezing, rhonchi or rales.     Comments: Clear to auscultation bilaterally Abdominal:     General: Bowel sounds are normal.     Palpations: Abdomen is soft.     Tenderness: There is no abdominal tenderness. There is no right CVA tenderness, left CVA tenderness, guarding or rebound.  Neurological:     Mental Status: He is alert.  Psychiatric:        Behavior: Behavior is cooperative.      UC Treatments / Results  Labs (all labs ordered are listed, but only abnormal results are displayed) Labs Reviewed  RESP PANEL BY RT-PCR (FLU A&B, COVID) [id number]    EKG   Radiology No results found.  Procedures Procedures (including critical care time)  Medications Ordered in UC Medications - No data to display  Initial Impression / Assessment and Plan / UC Course  I have reviewed the triage vital signs and the nursing notes.  Pertinent labs & imaging results that were available during my care of the patient were reviewed by me and considered in my medical decision making (see chart for details).     Patient is well-appearing, afebrile, nontoxic, nontachycardic.  No evidence of acute infection on physical exam that would warrant initiation of antibiotics.  Viral testing for flu and COVID was obtained today-results pending.  We will contact him if these are positive but he is outside the window of effectiveness for Tamiflu and not a candidate for COVID antiviral medications given he is young and otherwise healthy.  Will treat symptomatically with Promethazine DM.  Discussed this can be sedating and he is not  to drive or drink alcohol while taking this medication.  Flonase was sent to the pharmacy to help manage his congestion.  Recommend over-the-counter medication including Tylenol and Mucinex.  He is to rest and drink plenty of fluid.  We discussed return to work guidelines based on COVID test result he was provided a work excuse note.  If he has any persistent symptoms next week he should return for reevaluation.  If he has any worsening symptoms he needs to be seen immediately including high fever, chest pain, shortness  of breath she is to be seen immediately.  Strict return precautions given.  Final Clinical Impressions(s) / UC Diagnoses   Final diagnoses:  Upper respiratory tract infection, unspecified type  Acute cough     Discharge Instructions      I believe that you have a virus.  We tested you for flu and COVID we will contact you in the next several days if you are positive.  Use Promethazine DM for cough.  This will make you sleepy do not drive or drink alcohol taking it.  Use Flonase, Mucinex, Tylenol for additional symptom relief.  Make sure you rest and drink plenty of fluid.  If you have any worsening symptoms including high fever not responding to medication, chest pain, shortness of breath, nausea/vomiting interfere with oral intake, weakness you need to go to the emergency room immediately.     ED Prescriptions     Medication Sig Dispense Auth. Provider   promethazine-dextromethorphan (PROMETHAZINE-DM) 6.25-15 MG/5ML syrup Take 5 mLs by mouth 4 (four) times daily as needed for cough. 118 mL Misa Fedorko K, PA-C   fluticasone (FLONASE) 50 MCG/ACT nasal spray Place 1 spray into both nostrils daily. 16 g Jackelin Correia K, PA-C      PDMP not reviewed this encounter.   Jeani Hawking, PA-C 01/31/22 1358

## 2022-01-31 NOTE — ED Triage Notes (Signed)
Pt is here for fatigue, cough, vomiting, sore throat, headache , body aches , chills, fever , loss of appetite, back pain 4 days

## 2023-01-14 ENCOUNTER — Encounter (HOSPITAL_COMMUNITY): Payer: Self-pay

## 2023-01-14 ENCOUNTER — Ambulatory Visit (INDEPENDENT_AMBULATORY_CARE_PROVIDER_SITE_OTHER): Payer: No Typology Code available for payment source

## 2023-01-14 ENCOUNTER — Ambulatory Visit (HOSPITAL_COMMUNITY)
Admission: EM | Admit: 2023-01-14 | Discharge: 2023-01-14 | Disposition: A | Discharge status: Home / Self Care | Payer: No Typology Code available for payment source | Attending: Emergency Medicine | Admitting: Emergency Medicine

## 2023-01-14 DIAGNOSIS — R0602 Shortness of breath: Secondary | ICD-10-CM | POA: Diagnosis not present

## 2023-01-14 DIAGNOSIS — R059 Cough, unspecified: Secondary | ICD-10-CM

## 2023-01-14 DIAGNOSIS — J189 Pneumonia, unspecified organism: Secondary | ICD-10-CM | POA: Diagnosis not present

## 2023-01-14 MED ORDER — AMOXICILLIN 500 MG PO CAPS: 1000.0000 mg | ORAL_CAPSULE | Freq: Three times a day (TID) | ORAL | 0 refills | Status: AC

## 2023-01-14 MED ORDER — PROMETHAZINE-DM 6.25-15 MG/5ML PO SYRP: 5.0000 mL | ORAL_SOLUTION | Freq: Four times a day (QID) | ORAL | 0 refills | Status: DC | PRN

## 2023-01-14 MED ORDER — AZITHROMYCIN 250 MG PO TABS: 250.0000 mg | ORAL_TABLET | Freq: Every day | ORAL | 0 refills | Status: DC

## 2023-01-14 NOTE — ED Provider Notes (Signed)
[facility]    CSN: [id number] Arrival date & time: 01/14/23  1033      History   Chief Complaint Chief Complaint  Patient presents with   Cough    HPI William Werner is a 32 y.o. male.   Patient presents to clinic complaining of a productive cough for the past week.  Last night the cough got a lot worse and he ended up having posttussive emesis.  He does have pain with deep breathing, this also triggers coughing spells.  Abdominal pain from coughing.  Reports his cough has been productive and he has been coughing up 'cold.'  Endorses fevers when symptoms first started, none recently.  Reports he smokes sometimes.  No history of asthma.  Denies any wheezing.  Will get short of breath with coughing fits.  Denies sore throat, he is not currently congested but he does get congested sometimes.  He has tried over-the-counter cough drops, DayQuil and NyQuil.   The history is provided by the patient and medical records.  Cough   History reviewed. No pertinent past medical history.  Patient Active Problem List   Diagnosis Date Noted   CONTACT DERMATITIS 06/23/2008   PHARYNGITIS 12/01/2006    History reviewed. No pertinent surgical history.     Home Medications    Prior to Admission medications   Medication Sig Start Date End Date Taking? Authorizing Provider  amoxicillin (AMOXIL) 500 MG capsule Take 2 capsules (1,000 mg total) by mouth 3 (three) times daily for 5 days. 01/14/23 01/19/23 Yes Rinaldo Ratel, [place] N, FNP  azithromycin (ZITHROMAX) 250 MG tablet Take 1 tablet (250 mg total) by mouth daily. Take first 2 tablets together, then 1 every day until finished. 01/14/23  Yes Rinaldo Ratel, [place] N, FNP  promethazine-dextromethorphan (PROMETHAZINE-DM) 6.25-15 MG/5ML syrup Take 5 mLs by mouth 4 (four) times daily as needed for cough. 01/14/23  Yes Le Faulcon, [place] N, FNP    Family History History reviewed. No pertinent family history.  Social History Social  History   Tobacco Use   Smoking status: Some Days    Types: Cigarettes   Smokeless tobacco: Never  Substance Use Topics   Alcohol use: Yes    Comment: occasional    Drug use: No     Allergies   Patient has no known allergies.   Review of Systems Review of Systems  Per HPI   Physical Exam Triage Vital Signs ED Triage Vitals [01/14/23 1128]  Encounter Vitals Group     BP (!) 135/95     Systolic BP Percentile      Diastolic BP Percentile      Pulse Rate (!) 55     Resp 18     Temp 98.3 F (36.8 C)     Temp Source Oral     SpO2 97 %     Weight 190 lb (86.2 kg)     Height 6\' 3"  (1.905 m)     Head Circumference      Peak Flow      Pain Score 9     Pain Loc      Pain Education      Exclude from Growth Chart    No data found.  Updated Vital Signs BP (!) 135/95 (BP Location: Left Arm)   Pulse (!) 55   Temp 98.3 F (36.8 C) (Oral)   Resp 18   Ht 6\' 3"  (1.905 m)   Wt 190 lb (86.2 kg)   SpO2 97%   BMI  23.75 kg/m   Visual Acuity Right Eye Distance:   Left Eye Distance:   Bilateral Distance:    Right Eye Near:   Left Eye Near:    Bilateral Near:     Physical Exam Vitals and nursing note reviewed.  Constitutional:      Appearance: Normal appearance.  HENT:     Head: Normocephalic and atraumatic.     Right Ear: External ear normal.     Left Ear: External ear normal.     Nose: Nose normal.     Mouth/Throat:     Mouth: Mucous membranes are moist.  Eyes:     Conjunctiva/sclera: Conjunctivae normal.  Cardiovascular:     Rate and Rhythm: Normal rate and regular rhythm.     Heart sounds: Normal heart sounds. No murmur heard. Pulmonary:     Effort: Pulmonary effort is normal.     Breath sounds: Wheezing present.  Musculoskeletal:        General: Normal range of motion.  Skin:    General: Skin is warm and dry.  Neurological:     General: No focal deficit present.     Mental Status: He is alert.  Psychiatric:        Mood and Affect: Mood normal.       UC Treatments / Results  Labs (all labs ordered are listed, but only abnormal results are displayed) Labs Reviewed - No data to display  EKG   Radiology DG Chest 2 View  Result Date: 01/14/2023 CLINICAL DATA:  Cough.  Shortness of breath.  Some day smoker. EXAM: CHEST - 2 VIEW COMPARISON:  06/30/2011 FINDINGS: Midline trachea. Normal heart size and mediastinal contours. No pleural effusion or pneumothorax. Right middle lobe airspace disease centrally. Left lung clear. IMPRESSION: Central right middle lobe airspace disease, most consistent with pneumonia. Given smoking history, despite patient age, recommend radiographic follow-up to confirm resolution and exclude underlying central obstructive mass. Electronically Signed   By: Jeronimo Greaves M.D.   On: 01/14/2023 12:13    Procedures Procedures (including critical care time)  Medications Ordered in UC Medications - No data to display  Initial Impression / Assessment and Plan / UC Course  I have reviewed the triage vital signs and the nursing notes.  Pertinent labs & imaging results that were available during my care of the patient were reviewed by me and considered in my medical decision making (see chart for details).  Vitals and triage reviewed, patient is hemodynamically stable.  Lungs with minor expiratory wheezing in lower lobes.  Chest x-ray suspicious for pneumonia, radiology encouraged follow-up for repeat imaging within the next few weeks, encourage patient to return to clinic for recheck.  Started on dual coverage with amoxicillin and a macrolide for CAP.  Cough management discussed.  Plan of care, follow-up care return precautions given, no questions at this time.  Work note provided.     Final Clinical Impressions(s) / UC Diagnoses   Final diagnoses:  Community acquired pneumonia of right lower lobe of lung     Discharge Instructions      Your x-ray was suspicious for pneumonia.  Please take all antibiotics as  prescribed and until finished, you can take them with food to prevent gastrointestinal upset.  You can use the cough syrup as needed to help suppress your cough, do not drink or drive on this medication as it may cause drowsiness.  Ensure you are staying well-hydrated, I suggest sleeping with a humidifier to help moisten your secretions.  Your symptoms should improve with antibiotics, if no improvement or any changes please return to clinic.     ED Prescriptions     Medication Sig Dispense Auth. Provider   promethazine-dextromethorphan (PROMETHAZINE-DM) 6.25-15 MG/5ML syrup Take 5 mLs by mouth 4 (four) times daily as needed for cough. 118 mL Rinaldo Ratel, [place] N, [place]   amoxicillin (AMOXIL) 500 MG capsule Take 2 capsules (1,000 mg total) by mouth 3 (three) times daily for 5 days. 30 capsule Rinaldo Ratel, [place] N, [place]   azithromycin (ZITHROMAX) 250 MG tablet Take 1 tablet (250 mg total) by mouth daily. Take first 2 tablets together, then 1 every day until finished. 6 tablet Chadwick Reiswig, [place] N, [place]      PDMP not reviewed this encounter.   Tonnia Bardin, [place] N, [place] 01/14/23 1220

## 2023-01-14 NOTE — ED Triage Notes (Signed)
Patient having a productive cough for about a week. Last night the cough got worse and throwing up. Ps ain with coughing and deep breaths. No known sick exposure.   Tried dayquil, Nyquil, and cough drops with no relief.

## 2023-01-14 NOTE — Discharge Instructions (Signed)
Your x-ray was suspicious for pneumonia.  Please take all antibiotics as prescribed and until finished, you can take them with food to prevent gastrointestinal upset.  You can use the cough syrup as needed to help suppress your cough, do not drink or drive on this medication as it may cause drowsiness.  Ensure you are staying well-hydrated, I suggest sleeping with a humidifier to help moisten your secretions.    Your symptoms should improve with antibiotics, if no improvement or any changes please return to clinic.

## 2023-01-27 ENCOUNTER — Ambulatory Visit (HOSPITAL_COMMUNITY)
Admission: EM | Admit: 2023-01-27 | Discharge: 2023-01-27 | Disposition: A | Discharge status: Home / Self Care | Payer: No Typology Code available for payment source | Attending: Family Medicine | Admitting: Family Medicine

## 2023-01-27 ENCOUNTER — Encounter (HOSPITAL_COMMUNITY): Payer: Self-pay

## 2023-01-27 DIAGNOSIS — S61111A Laceration without foreign body of right thumb with damage to nail, initial encounter: Secondary | ICD-10-CM

## 2023-01-27 NOTE — Discharge Instructions (Signed)
Wash the wound with soapy water 1-2 times a day.  Keep a fairly bulky bandage on it at first to protect it.  As it is healing clipped the nail to keep it short.  Then you can start using regular Band-Aids as it is healing

## 2023-01-27 NOTE — ED Provider Notes (Signed)
[facility]    CSN: [id number] Arrival date & time: 01/27/23  1747      History   Chief Complaint Chief Complaint  Patient presents with   Finger Injury    HPI William Werner is a 32 y.o. male.   HPI Here for a cut to his thumb that happened when cutting lettuce on 11/20.  The knife had gone through the end of his thumbnail on the right hand.  It had stuck down but then he caught it on something as he was going to work an hour or 2 ago.  The nail flipped up and it caused it to bleed.  This caused him to be concerned that it meant it was very deep. Last tetanus was sometime ago    History reviewed. No pertinent past medical history.  Patient Active Problem List   Diagnosis Date Noted   CONTACT DERMATITIS 06/23/2008   PHARYNGITIS 12/01/2006    History reviewed. No pertinent surgical history.     Home Medications    Prior to Admission medications   Not on File    Family History History reviewed. No pertinent family history.  Social History Social History   Tobacco Use   Smoking status: Some Days    Types: Cigarettes   Smokeless tobacco: Never  Substance Use Topics   Alcohol use: Yes    Comment: occasional    Drug use: No     Allergies   Patient has no known allergies.   Review of Systems Review of Systems   Physical Exam Triage Vital Signs ED Triage Vitals  Encounter Vitals Group     BP 01/27/23 1852 (!) 133/92     Systolic BP Percentile --      Diastolic BP Percentile --      Pulse Rate 01/27/23 1852 64     Resp 01/27/23 1852 16     Temp 01/27/23 1852 97.9 F (36.6 C)     Temp Source 01/27/23 1852 Oral     SpO2 01/27/23 1852 98 %     Weight 01/27/23 1852 190 lb 0.6 oz (86.2 kg)     Height 01/27/23 1852 6\' 3"  (1.905 m)     Head Circumference --      Peak Flow --      Pain Score 01/27/23 1851 8     Pain Loc --      Pain Education --      Exclude from Growth Chart --    No data found.  Updated Vital Signs BP (!)  133/92 (BP Location: Right Arm)   Pulse 64   Temp 97.9 F (36.6 C) (Oral)   Resp 16   Ht 6\' 3"  (1.905 m)   Wt 86.2 kg   SpO2 98%   BMI 23.75 kg/m   Visual Acuity Right Eye Distance:   Left Eye Distance:   Bilateral Distance:    Right Eye Near:   Left Eye Near:    Bilateral Near:     Physical Exam Vitals reviewed.  Constitutional:      General: He is not in acute distress.    Appearance: He is not toxic-appearing.  Skin:    Coloration: Skin is not pale.     Comments: There is a laceration at the tip of the right thumb near the corner of the nail and then the laceration apparently goes in a circular fashion and apparently cut off about a 0.5 cm diameter circular area.  Currently when I am  examining the patient it is stuck down and there is no bleeding.  The laceration is shallow and is about 2 or 3 mm in length.  It also is not bleeding no swelling  Neurological:     General: No focal deficit present.     Mental Status: He is alert and oriented to person, place, and time.      UC Treatments / Results  Labs (all labs ordered are listed, but only abnormal results are displayed) Labs Reviewed - No data to display  EKG   Radiology No results found.  Procedures Procedures (including critical care time)  Medications Ordered in UC Medications - No data to display  Initial Impression / Assessment and Plan / UC Course  I have reviewed the triage vital signs and the nursing notes.  Pertinent labs & imaging results that were available during my care of the patient were reviewed by me and considered in my medical decision making (see chart for details).      I discussed with him that it was too late to do any laceration repair.  We discussed options, mainly either letting the nail splint and be a bandage for the area versus doing a digital block and removing that portion of the nail that has been cut.  He wanted to leave the nail in place.  I think that is  reasonable  Bacitracin and a bulky bandage are applied.  He is sent home with extra supplies to do bulky bandages several times a day when at work.  Also he is given several regular bandages to start using when it is healing.  Also we discussed his clipping the nail as it grows to keep it from snagging on anything.  He will wash the area once daily at least with soapy water  He really does not want to do the tetanus booster and since this is not a dirty wound I think that is also okay today.  Final Clinical Impressions(s) / UC Diagnoses   Final diagnoses:  Laceration of right thumb without foreign body with damage to nail, initial encounter     Discharge Instructions      Wash the wound with soapy water 1-2 times a day.  Keep a fairly bulky bandage on it at first to protect it.  As it is healing clipped the nail to keep it short.  Then you can start using regular Band-Aids as it is healing    ED Prescriptions   None    PDMP not reviewed this encounter.   Zenia Resides, MD 01/27/23 [phone]

## 2023-01-27 NOTE — ED Triage Notes (Signed)
Right thumb injury onset 2 Days ago. Patient states he was cutting some lettuce and the knife went through his thumb nail. States the bleeding was bad at first but has now stopped.   Patient was on the way to work and his bag caught the finger nail and lifted the nail back up.
